# Patient Record
Sex: Male | Born: 1960
Health system: Southern US, Community
[De-identification: ages and names within clinical notes are randomized; demographics above are authoritative.]

---

## 2017-11-01 ENCOUNTER — Ambulatory Visit: Payer: Self-pay | Admitting: Podiatry

## 2017-11-03 ENCOUNTER — Ambulatory Visit (INDEPENDENT_AMBULATORY_CARE_PROVIDER_SITE_OTHER): Payer: 59 | Admitting: Podiatry

## 2017-11-03 ENCOUNTER — Encounter: Payer: Self-pay | Admitting: Podiatry

## 2017-11-03 VITALS — BP 117/79 | HR 58 | Resp 16

## 2017-11-03 DIAGNOSIS — L603 Nail dystrophy: Secondary | ICD-10-CM

## 2017-11-03 NOTE — Progress Notes (Signed)
  Subjective:  Patient ID: Jerry Gonzalez, male    DOB: 04/12/1961,  MRN: [id number] HPI Chief Complaint  Patient presents with  . Nail Problem    Hallux left - medial border, thick and discolored nail x several months, sometimes tender, uses cream on it    56 y.o. male presents with the above complaint.     No past medical history on file. No past surgical history on file. No current outpatient medications on file.  No Known Allergies Review of Systems  All other systems reviewed and are negative.  Objective:   Vitals:   11/03/17 1331  BP: 117/79  Pulse: (!) 58  Resp: 16    General: Well developed, nourished, in no acute distress, alert and oriented x3   Dermatological: Skin is warm, dry and supple bilateral. Nails x 10 are well maintained; remaining integument appears unremarkable at this time. There are no open sores, no preulcerative lesions, no rash or signs of infection present. Thickening of the nail plate along the tibial border of the hallux left. Mild yellow discoloration with subungual debris. Tender to palpation.  Vascular: Dorsalis Pedis artery and Posterior Tibial artery pedal pulses are 2/4 bilateral with immedate capillary fill time. Pedal hair growth present. No varicosities and no lower extremity edema present bilateral.   Neruologic: Grossly intact via light touch bilateral. Vibratory intact via tuning fork bilateral. Protective threshold with Semmes Wienstein monofilament intact to all pedal sites bilateral. Patellar and Achilles deep tendon reflexes 2+ bilateral. No Babinski or clonus noted bilateral.   Musculoskeletal: No gross boney pedal deformities bilateral. No pain, crepitus, or limitation noted with foot and ankle range of motion bilateral. Muscular strength 5/5 in all groups tested bilateral.  Gait: Unassisted, Nonantalgic.    Radiographs:  Not taken  Assessment & Plan:   Assessment: Nail dystrophy hallux left  Plan: Samples of the skin  and nail taken today for pathologic evaluation. We will notify him as to those results.      T. LewisburgHyatt, [place]

## 2017-12-01 ENCOUNTER — Encounter: Payer: Self-pay | Admitting: Podiatry

## 2017-12-01 ENCOUNTER — Ambulatory Visit (INDEPENDENT_AMBULATORY_CARE_PROVIDER_SITE_OTHER): Payer: 59 | Admitting: Podiatry

## 2017-12-01 ENCOUNTER — Telehealth: Payer: Self-pay | Admitting: Podiatry

## 2017-12-01 DIAGNOSIS — L603 Nail dystrophy: Secondary | ICD-10-CM | POA: Diagnosis not present

## 2017-12-01 NOTE — Telephone Encounter (Signed)
I saw Dr. Al CorpusHyatt at 1:45 pm today and forgot to ask if I could get a copy of my pathology report. If you could please mail that to my home address. My phone number is [phone]. Thank you.

## 2017-12-01 NOTE — Patient Instructions (Addendum)

## 2017-12-01 NOTE — Progress Notes (Signed)
He presents today for follow-up of his toenail fungus pathology report.  He states that he does not want to take any oral medication nor does he want to do laser therapy.  He would like something topical.  Objective: Vital signs are stable he is alert and oriented x3.  Pathology report does demonstrate a saprophyte.  At this point I expressed to him that there is nothing oral or topical that she can use for the separate 5 laser would be his best therapy he understands.  Assessment onychomycosis.  Plan: At this point he would like to use Naftin cream to see if this would work and I expressed to him that he could try Lamisil over-the-counter cream that he is going to do this first and I will follow-up with him on an as-needed basis.

## 2017-12-01 NOTE — Telephone Encounter (Signed)
Called pt and left voicemail that they would need to sign a medical records release form to pick up requested pathology report. Told him he could come back to the office to fill out the form and get the pathology report then or we could mail him one, fax, or e-mail it to him. I asked him to call me back directly at [phone] to confirm he received the message.

## 2018-01-19 DIAGNOSIS — R1013 Epigastric pain: Secondary | ICD-10-CM | POA: Diagnosis not present

## 2018-07-25 ENCOUNTER — Ambulatory Visit (INDEPENDENT_AMBULATORY_CARE_PROVIDER_SITE_OTHER): Payer: 59 | Admitting: Podiatry

## 2018-07-25 ENCOUNTER — Encounter: Payer: Self-pay | Admitting: Podiatry

## 2018-07-25 DIAGNOSIS — Q828 Other specified congenital malformations of skin: Secondary | ICD-10-CM | POA: Diagnosis not present

## 2018-07-26 NOTE — Progress Notes (Signed)
He presents today with chief concern of a small callused area to the forefoot left.  States that it hurts when he is jogging.  Objective: Vital signs are stable alert and oriented x3.  Pulses are palpable.  Small porokeratotic lesion plantar aspect of the forefoot.  No open lesions or wounds.  Assessment: Porokeratosis   Plan: Debrided the area today should alleviate his symptoms nicely.  Follow-up with him as needed

## 2018-09-01 DIAGNOSIS — Z125 Encounter for screening for malignant neoplasm of prostate: Secondary | ICD-10-CM | POA: Diagnosis not present

## 2018-09-01 DIAGNOSIS — R2232 Localized swelling, mass and lump, left upper limb: Secondary | ICD-10-CM | POA: Diagnosis not present

## 2018-09-01 DIAGNOSIS — Z23 Encounter for immunization: Secondary | ICD-10-CM | POA: Diagnosis not present

## 2018-09-01 DIAGNOSIS — Z13220 Encounter for screening for lipoid disorders: Secondary | ICD-10-CM | POA: Diagnosis not present

## 2018-09-05 ENCOUNTER — Other Ambulatory Visit: Payer: Self-pay | Admitting: Family Medicine

## 2018-09-05 DIAGNOSIS — R2232 Localized swelling, mass and lump, left upper limb: Secondary | ICD-10-CM

## 2018-09-08 ENCOUNTER — Ambulatory Visit
Admission: RE | Admit: 2018-09-08 | Discharge: 2018-09-08 | Disposition: A | Discharge status: Home / Self Care | Payer: 59 | Source: Ambulatory Visit | Attending: Family Medicine | Admitting: Family Medicine

## 2018-09-08 DIAGNOSIS — R2232 Localized swelling, mass and lump, left upper limb: Secondary | ICD-10-CM

## 2020-01-26 IMAGING — US US EXTREM UP*L* LTD
1 series · 14 of 21 positions shown · non-contrast
Comparison: None

CLINICAL DATA: Palpable lump in the left upper arm (triceps area)
for 2 weeks.

EXAM:
ULTRASOUND left UPPER EXTREMITY LIMITED
TECHNIQUE: Ultrasound examination of the upper extremity soft tissues was
performed in the area of clinical concern.

[Series 1: us extrem up*left* ltd · 0.06mm/px · 21 acquisitions, 14 frames shown]
[im 1/21]
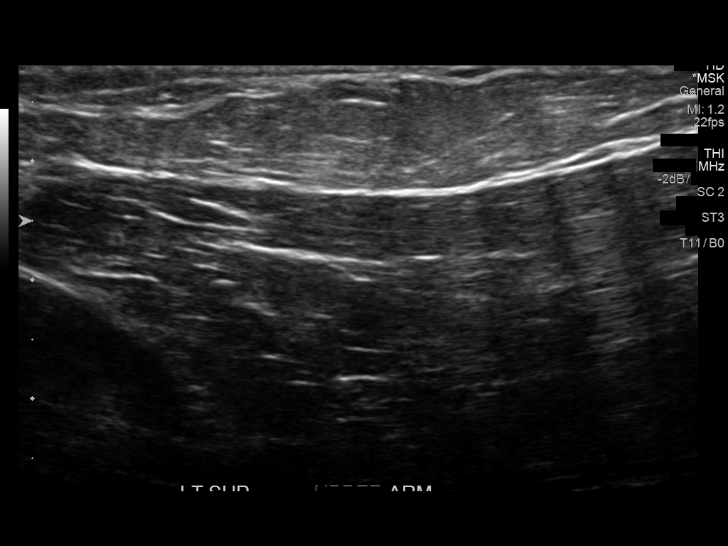
[im 3/21]
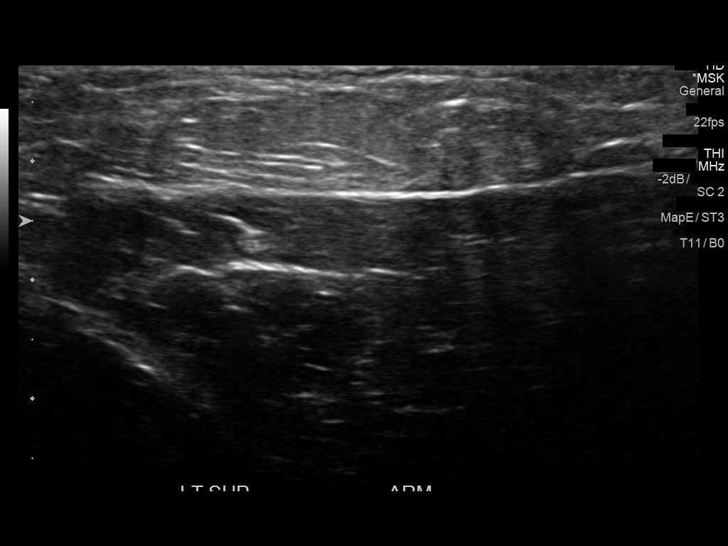
[im 4/21]
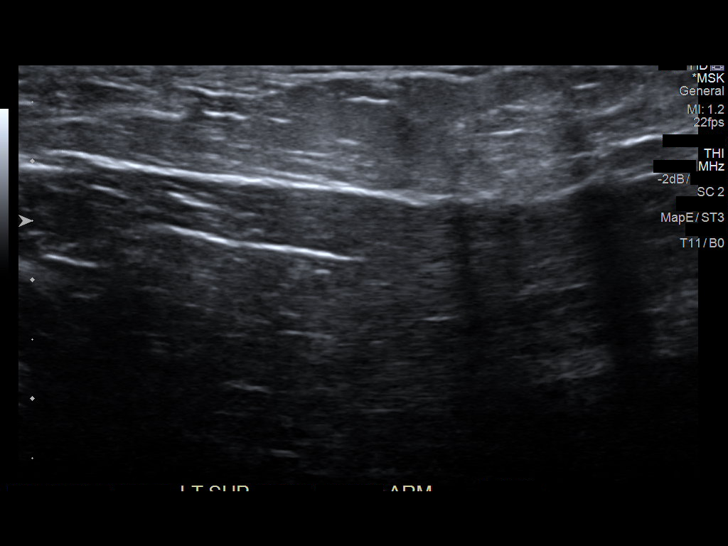
[im 6/21]
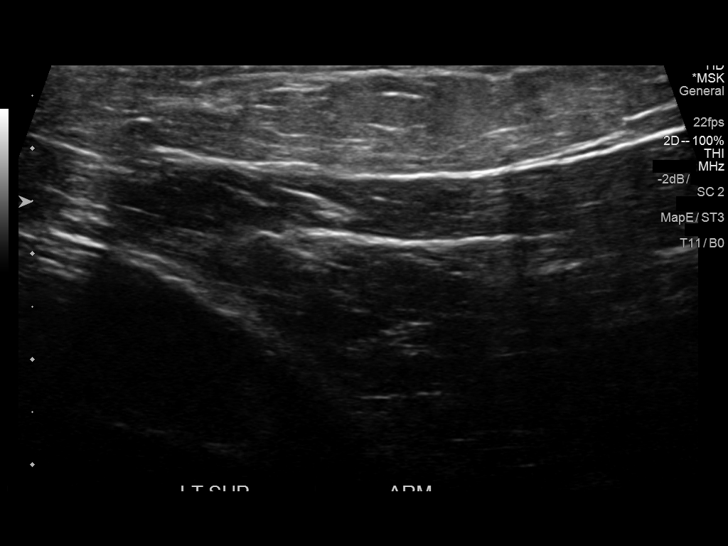
[im 7/21]
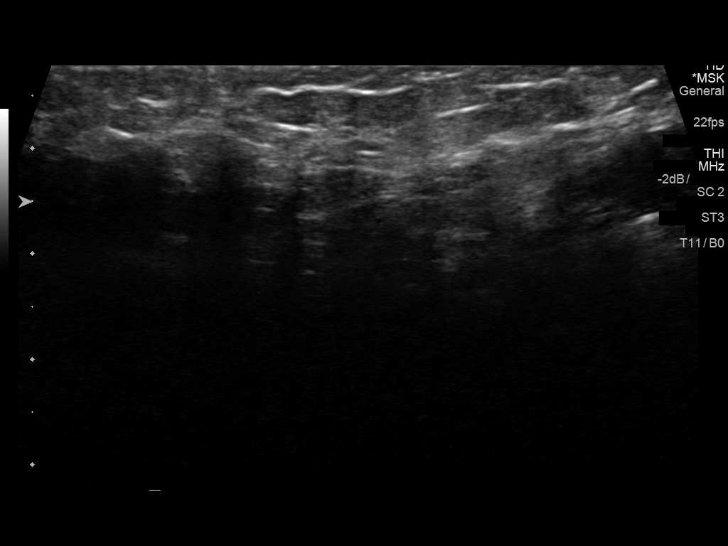
[im 9/21]
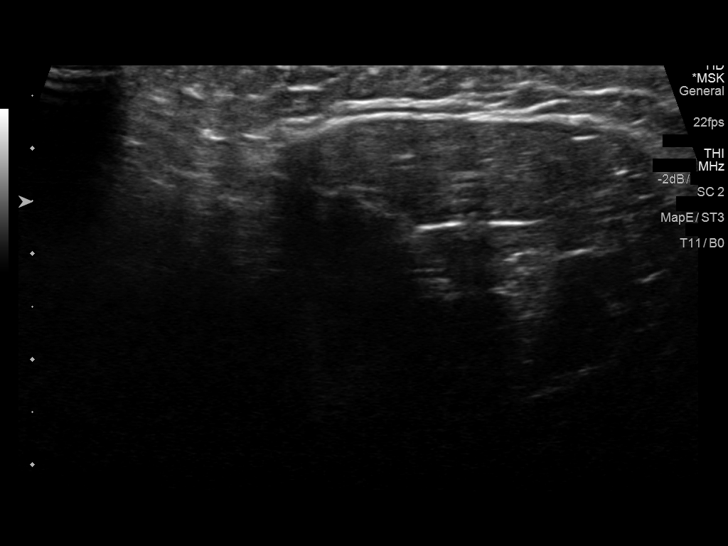
[im 10/21]
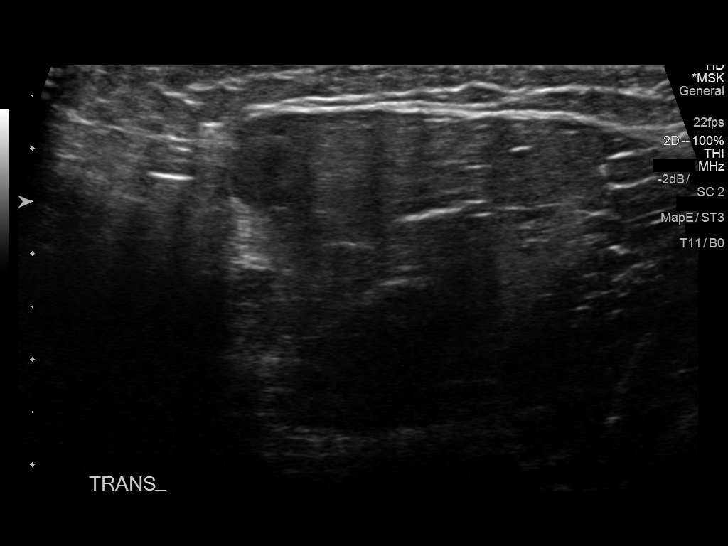
[im 12/21]
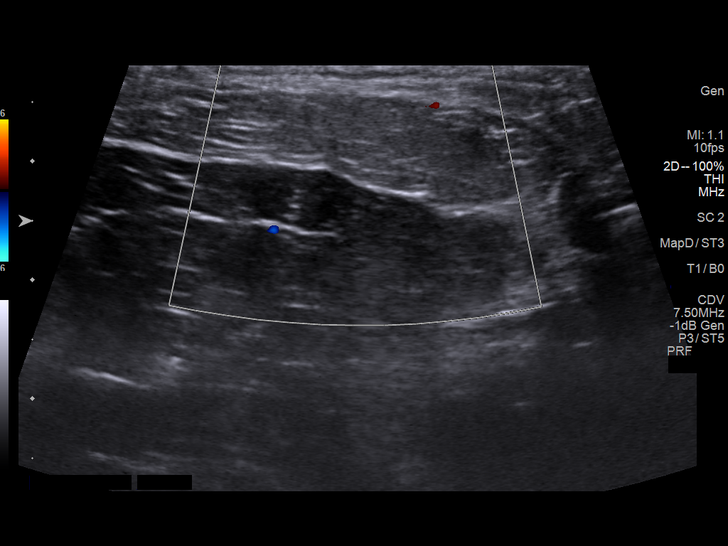
[im 13/21]
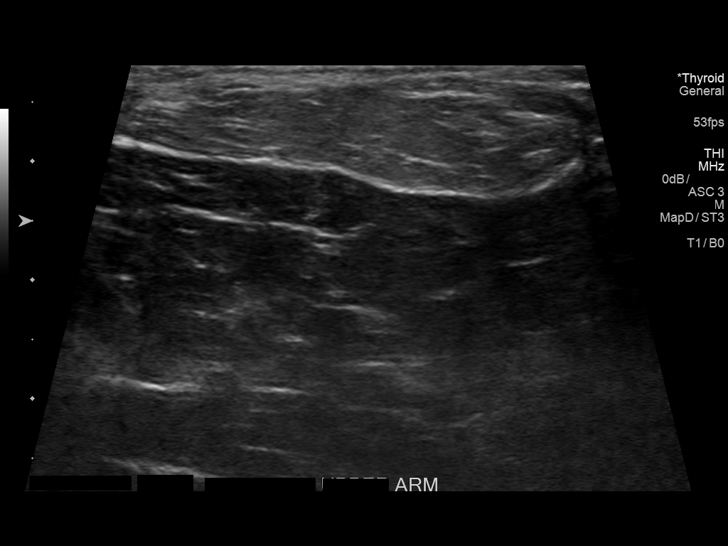
[im 15/21]
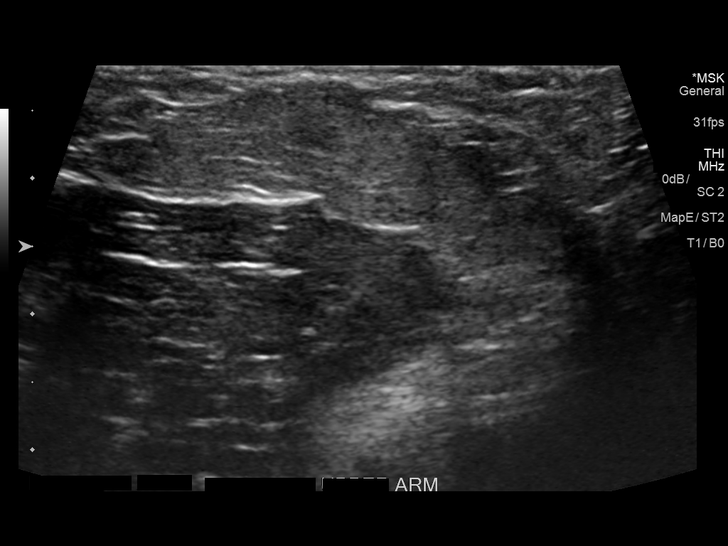
[im 16/21]
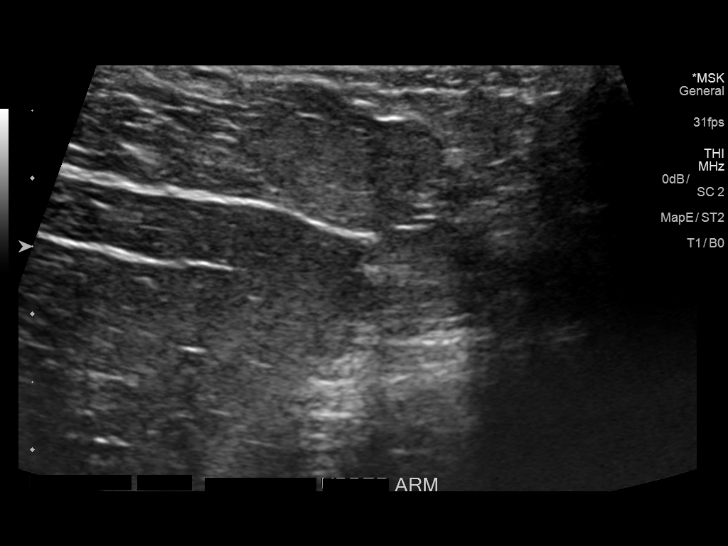
[im 18/21]
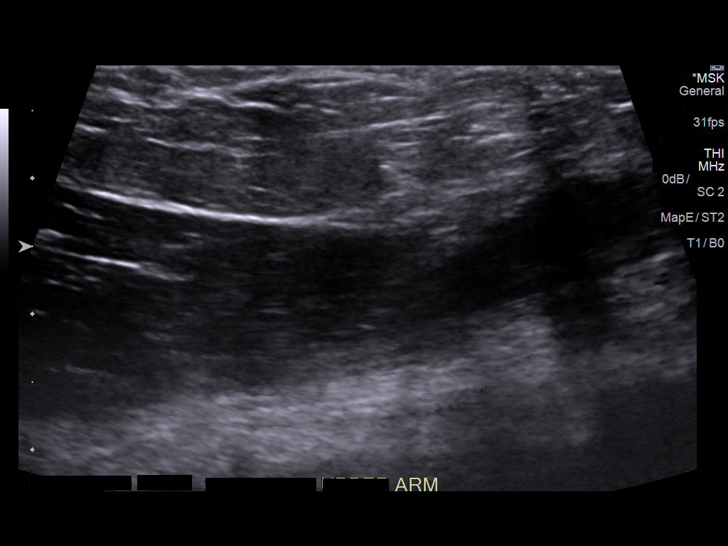
[im 19/21]
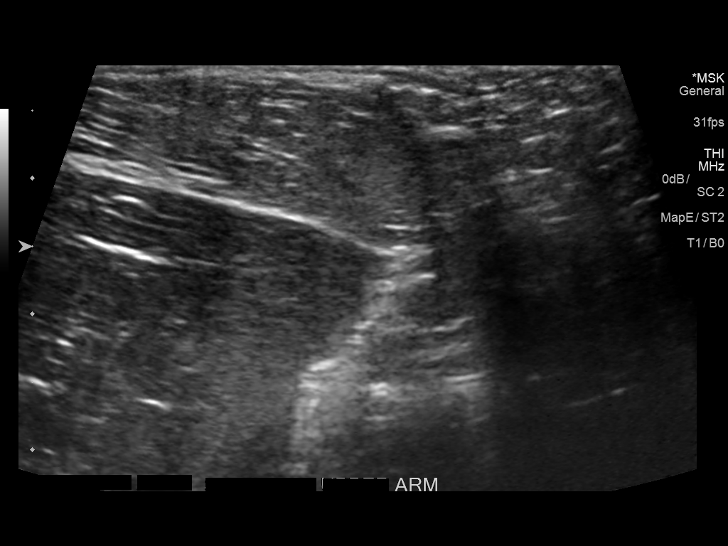
[im 21/21]
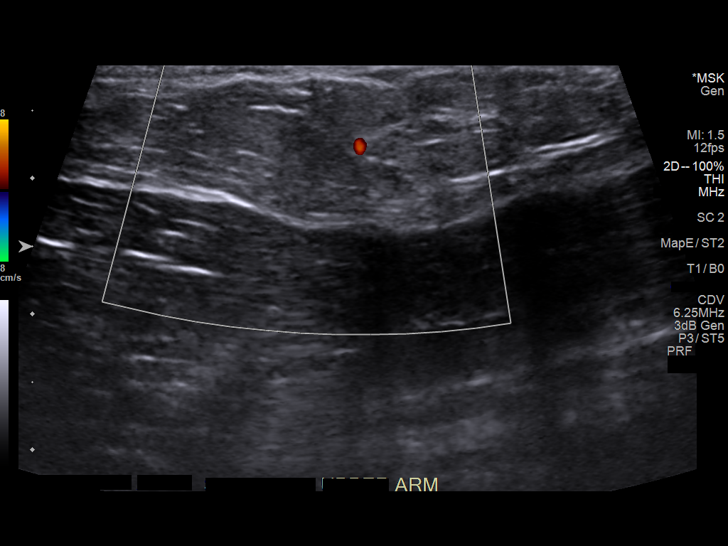

[14 of 21 positions shown; findings below may reference images not displayed]

FINDINGS: Fairly homogeneous appearing subcutaneous fat without discrete
lipoma. No solid or cystic lesion is identified. The underlying
triceps musculature appears very homogeneous. No muscle mass is
identified.

This area was compared to the right upper extremity and appears very
similar.
IMPRESSION: No definite cystic or solid lesion is identified in the area the
patient's palpable abnormality. I would recommend continued clinical
surveillance and MR imaging if this enlarges or changes on clinical
examination.

## 2025-01-25 ENCOUNTER — Other Ambulatory Visit (HOSPITAL_BASED_OUTPATIENT_CLINIC_OR_DEPARTMENT_OTHER): Payer: Self-pay | Admitting: Family Medicine

## 2025-01-25 DIAGNOSIS — E7800 Pure hypercholesterolemia, unspecified: Secondary | ICD-10-CM
